# Patient Record
Sex: Female | Born: 2000 | Race: Black or African American | Hispanic: No | Marital: Single | Smoking: Never smoker
Health system: Southern US, Community
[De-identification: ages and names within clinical notes are randomized; demographics above are authoritative.]

## PROBLEM LIST (undated history)

## (undated) HISTORY — PX: LEG SURGERY: SHX1003

## (undated) HISTORY — PX: TIBIA FRACTURE SURGERY: SHX806

## (undated) HISTORY — PX: FRACTURE SURGERY: SHX138

---

## 2018-01-04 ENCOUNTER — Encounter (FREE_STANDING_LABORATORY_FACILITY): Payer: 59 | Admitting: NURSE PRACTITIONER-WOMENS HEALTH

## 2018-01-04 ENCOUNTER — Ambulatory Visit (INDEPENDENT_AMBULATORY_CARE_PROVIDER_SITE_OTHER): Payer: 59 | Admitting: NURSE PRACTITIONER-WOMENS HEALTH

## 2018-01-04 ENCOUNTER — Encounter (FREE_STANDING_LABORATORY_FACILITY)
Admit: 2018-01-04 | Discharge: 2018-01-04 | Disposition: A | Discharge status: Home / Self Care | Payer: 59 | Attending: NURSE PRACTITIONER-WOMENS HEALTH | Admitting: NURSE PRACTITIONER-WOMENS HEALTH

## 2018-01-04 ENCOUNTER — Encounter (INDEPENDENT_AMBULATORY_CARE_PROVIDER_SITE_OTHER): Payer: Self-pay | Admitting: NURSE PRACTITIONER-WOMENS HEALTH

## 2018-01-04 VITALS — BP 142/91 | HR 98 | Temp 97.2°F | Resp 17 | Ht 67.2 in | Wt 171.6 lb

## 2018-01-04 DIAGNOSIS — N898 Other specified noninflammatory disorders of vagina: Secondary | ICD-10-CM | POA: Insufficient documentation

## 2018-01-04 DIAGNOSIS — Z3202 Encounter for pregnancy test, result negative: Secondary | ICD-10-CM

## 2018-01-04 MED ORDER — TERCONAZOLE 0.4 % VAGINAL CREAM
1.00 | TOPICAL_CREAM | Freq: Every evening | VAGINAL | 0 refills | Status: AC
Start: 2018-01-04 — End: ?

## 2018-01-04 NOTE — Progress Notes (Signed)
WELL [place] [facility], HEALTH/EDUCATION BLDG  [address]  [place] [address]    PATIENT NAME:  Jasmine Erickson  MRN:  [id number]  DOB:  06-Jul-2000  DATE OF SERVICE: 01/04/2018    Chief Complaint   Patient presents with   . Vaginal Bumps     irritation       History of Present Illness: Tatumn Aispuro is a 17 y.o. female who presents to [facility] today for the above complaint.  HPI    Patient admits to   new onset of genital lesions x 1 day  Patient denies  exposure to STI  Known exposure to no known diseases.   Patient has history of  no prior STI's..   Last sexual contact 1Day ago with 1 partners.  Was this contact consensual? yes   Contact protected: no   Condom use: 50%-25%of the time.   Sexual partners in last 60 days: 1  Lifetime sexual partners: 1  Gender of current/past partners Female.  Contraception method: oral contraceptives  Participates in oral vaginal sex.   Hx of IVDU or intranasal DU in self or past/present partners: no  Has paid or has been paid for sex in the past: no  Have you completed the HPV vaccine series: yes    On average, how many days/week do you engage in moderate to vigorous physical activity (like brisk walking)?: 0 Days      Past Medical History:    History reviewed. No pertinent past medical history.      Past Surgical History:    Past Surgical History:   Procedure Laterality Date   . LEG SURGERY Right          Allergies:  No Known Allergies  Medications:  Outpatient Medications Marked as Taking for the 01/04/18 encounter (Office Visit) with Anthoney Harada, APRN,WHNP-BC   Medication Sig   . oral contraceptive (PATIENT'S OWN SUPPLY) Take 1 Tab by mouth Once a day   . terconazole (TERAZOL 7) 0.4 % Vaginal Cream 1 Applicator by Vaginal route Every night     Social History:    Social History     Tobacco Use   . Smoking status: Never Smoker   . Smokeless tobacco: Never Used   Substance Use Topics   . Alcohol use: Yes      Family History:  Family Medical  History:     None            Review of Systems:  Review of Systems   Constitutional: Negative for chills and fever.   Gastrointestinal: Negative for nausea and vomiting.   Genitourinary: Positive for genital sores. Negative for dysuria, flank pain, frequency, menstrual problem, pelvic pain, vaginal discharge and vaginal pain.     Physical Exam:  Vitals:    01/04/18 1001   BP: (!) 142/91   Pulse: 98   Resp: 17   Temp: 36.2 C (97.2 F)   TempSrc: Thermal Scan   SpO2: 96%   Weight: 77.8 kg (171 lb 9.6 oz)   Height: 1.707 m (5' 7.2")   BMI: 26.77     87 %ile (Z= 1.13) based on CDC (Girls, 2-20 Years) BMI-for-age based on BMI available as of 01/04/2018.    Body mass index is 26.72 kg/m.  Physical Exam  Constitutional:       Appearance: She is well-developed.   HENT:      Head: Normocephalic and atraumatic.   Genitourinary:  General: Normal vulva.      Exam position: Lithotomy position.      Labia:         Right: Tenderness present. No rash or lesion.         Left: Tenderness present. No rash or lesion.       Vagina: No foreign body. Erythema present. No vaginal discharge.      Cervix: No discharge, lesion or erythema.      Uterus: Not enlarged and not tender.       Adnexa:         Right: No mass, tenderness or fullness.          Left: No mass, tenderness or fullness.         Musculoskeletal: Normal range of motion.   Skin:     General: Skin is warm and dry.   Neurological:      Mental Status: She is alert and oriented to person, place, and time.   Psychiatric:         Behavior: Behavior normal.         Thought Content: Thought content normal.         Judgment: Judgment normal.       Ortho Exam    Data Reviewed:    POCT Results:       Provider Performed POCT 01/04/2018   [facility] [address], [place], [place] [id number]   Time Mon Jan 04, 2018 11:01 AM   Patient Gender Female   Time 10:25 AM   Trichomonas Test  None Seen   Trichomonas Reference Range (Required) None Seen   Yeast Test  Seen   Yeast Ref  Range(Required) None Seen   Clue Cells Test None Seen   Clue Cells Ref Range (Required) None Seen   Specimen Source Vaginal Fluid       Urine Pregnancy: Negative    I have reviewed and confirmed the above point of care results.  Anthoney Haradaarrie A Pratt, APRN,WHNP-BC 01/04/2018, 11:01  UML labs ordered    Assessment:   1. Vaginal irritation      Plan:    Orders Placed This Encounter   . NEISSERIA GONORRHOEAE DNA BY PCR   . CHLAMYDIA TRACHOMITIS DNA BY PCR (INHOUSE)   . HERPES SIMPLEX VIRUS (HSV1/HSV2), PCR, CSF OR SWAB   . POCT VAGINAL KOH & SALINE (WET MOUNT ONLY)   . POCT URINE PREGNANCY   . terconazole (TERAZOL 7) 0.4 % Vaginal Cream     Use terazol nightly x 7 nights  STD testing will be back in a few days and we will call with results all positive STDs are reported to the [facility] per [place] law. The best way to protect yourself from STDs is reducing number of partners and 100% condom use       Return if symptoms worsen or fail to improve.  Anthoney Haradaarrie A Pratt, APRN,WHNP-BC  The co-signing faculty was physically present in [facility] and available for consultation and did not particpate in the care of this patient.

## 2018-01-04 NOTE — Patient Instructions (Addendum)
Preventing Vaginitis     Use mild, unscented soap when you bathe or shower to avoid irritating your vagina.    Vaginitis is irritation or infection of the vagina or the outside opening of it (vulva).Vaginitis can be caused by bacteria, viruses, parasites, or yeast. Chemicals such as in perfumes or soaps or in spermicides can sometimes be a cause. Vaginitis can be caused by hormone changes in pregnancy or with menopause.You can help prevent vaginitis. Follow the tips below. And see your healthcare provider if you have any symptoms.   Hygiene   Stay away from chemicals. Don't use vaginal sprays. Don't use scented toilet paper or tampons that are scented. Sprays and scents have chemicals that can irritate your vagina.   Don't douche unless you are told to by your healthcare provider. Douching is rarely needed. And it upsets the normal balance in the vagina.   Wash yourself well. Wash the outer vaginal area (vulva) every day with mild, unscented soap. Keep it as dry as possible.   Wipe correctly. Make sure to wipe from front to back after a bowel movement. This helps keep from spreading bacteria from your anus to your vagina.   Change your tampon often. During your period, make sure to change your tampon as often as directed on the package. This allows the normal flow of vaginal discharge and blood.    Lifestyle   Limit your number of sexual partners. The more partners you have, the greater your risk of infection. Using condoms helps reduce your risk.   Get enough sleep. Sleep helps keep your body's immune system healthy. This helps you fight infection.   Lose weight, if needed. Excess weight can reduce air circulation around your vagina. This can increase your risk of infection.   Exercise regularly. Regular activity helps keep your body healthy.   Take antibiotics only as directed.Antibiotics can change the normal chemical balance in the vagina.    Clothing   Don't sit in wet clothes. Yeast thrives  when it's warm and damp.   Don't wear tight pants. And don't wear tights, leggings, or hose without a cotton crotch. These types of clothing trap warmth and moisture.   Wear cotton underwear. Cotton lets air circulate around the vagina.    Symptoms of vaginitis   Irritation, swelling, or itching of the genital area   Vaginal discharge   Bad vaginal odor   Pain or burning during urination    StayWell last reviewed this educational content on 11/20/2017   2000-2019 [employer], [place]. [address], [place], [place] [address]. All rights reserved. This information is not intended as a substitute for professional medical care. Always follow your healthcare professional's instructions.        [facility]     Operated by [facility]  [address], [place] [address]  Phone: [phone]  Fax: [phone]  [email or web address]  Twitter [email or web address]   Closed all  Holidays        Attending Caregiver: Anthoney Harada, APRN,WHNP-BC    Today's orders:   Orders Placed This Encounter   . NEISSERIA GONORRHOEAE DNA BY PCR   . CHLAMYDIA TRACHOMITIS DNA BY PCR (INHOUSE)   . HERPES SIMPLEX VIRUS (HSV1/HSV2), PCR, CSF OR SWAB   . POCT VAGINAL KOH & SALINE (WET MOUNT ONLY)   . POCT URINE PREGNANCY   . terconazole (TERAZOL 7) 0.4 % Vaginal Cream         Prescription(s) E-Rx to:  [facility] -  [place], [place] - [address]    Future appts with Student [facility]  Visit date not found    PLEASE ACTIVATE YOUR [facility] MYCHART. THIS IS ONE EASY WAY TO COMMUNICATE WITH [facility].  SEE THE CODE ON THIS FORM FOR ACCESS.    -----------------------------PRIVACY INFORMATION-----------------------------------------------  As a [employer] student, regardless of your age, we cannot discuss your personal health information with a parent, spouse, family member or anyone else without your expressed consent.    This policy does not include:  Individuals who would have  a legitimate reason to access your records and information to assist in your care under the provisions of HIPAA [facility](Health Insurance Portability and Accountability Act) law;   Individuals with whom you have previously given expressed written consent to do so, such a legal guardian or Power of [address].    No one can access your [facility], unless you give them your account sign on and password. You will receive emails from [facility].  This means anyone who has access to the email account you provided can see this notification. There will be no private medical information included in these emails. This notification of  new medical information  available in your [facility], may be information that you do not want others to know.   _______________________________________________________________________    If your symptoms persist, worsen or you develop any new or concerning symptoms please call [facility] for at [phone] for a follow up appointment.   If your symptoms are severe, go immediately to the emergency department or call 911.  Please check with your health insurance coverage for these types of visits.   Please keep all follow up visit recommended at today's visit.    If you received x-rays during your visit, be aware that the final and formal interpretation of those films by a radiologist will occur after your discharge.  If there is a significant discrepancy identified after your discharge, we will contact you at the telephone number provided during registration.  These results are available for your review on [facility].    Please refer to your [facility] for lab test results. Lab test results related to HIV, STI's and pregnancy are considered private and are therefore not immediately visible in your [facility], we may still be able to send a generic MyChart message for these results.  If you have cultures pending for sexually transmitted diseases, you will be contacted by phone.     Positive  cultures are reported to the [facility] Department of Health, as required by state law. These results are considered private on [facility] and can only be released by the provider.We will not contact you if the results are negative.    It is very important that we have a phone number that is the single best way to contact you in the event that we become aware of important clinical information or concerns after your discharge.  If the phone number you provided at registration is NOT this number you should inform staff and registration prior to leaving.     Please call [facility] at [phone] with any further questions.    Instructions discussed with patient upon discharge by clinical staff with all questions answered.    Anthoney Haradaarrie A Pratt, APRN,WHNP-BC 01/04/2018, 10:25

## 2018-01-05 LAB — HERPES SIMPLEX VIRUS (HSV1/HSV2), PCR, CSF OR SWAB
HERPES SIMPLEX VIRUS 1: NEGATIVE
HERPES SIMPLEX VIRUS 2: NEGATIVE

## 2018-01-05 LAB — NEISSERIA GONORRHOEAE DNA BY PCR: NEISSERIA GONORRHOEAE PCR: NOT DETECTED

## 2018-01-05 LAB — CHLAMYDIA TRACHOMITIS DNA BY PCR (INHOUSE): CHLAMYDIA TRACHOMATIS PCR: NOT DETECTED

## 2019-03-20 ENCOUNTER — Other Ambulatory Visit: Payer: Self-pay

## 2019-03-20 ENCOUNTER — Ambulatory Visit (HOSPITAL_COMMUNITY)
Admission: EM | Admit: 2019-03-20 | Discharge: 2019-03-20 | Disposition: A | Discharge status: Home / Self Care | Payer: 59 | Attending: Physician Assistant | Admitting: Physician Assistant

## 2019-03-20 ENCOUNTER — Encounter (HOSPITAL_COMMUNITY): Payer: Self-pay

## 2019-03-20 DIAGNOSIS — R112 Nausea with vomiting, unspecified: Secondary | ICD-10-CM | POA: Diagnosis not present

## 2019-03-20 DIAGNOSIS — R197 Diarrhea, unspecified: Secondary | ICD-10-CM

## 2019-03-20 MED ORDER — ONDANSETRON 4 MG PO TBDP
ORAL_TABLET | ORAL | Status: AC
  Filled 2019-03-20: qty 1

## 2019-03-20 MED ORDER — ONDANSETRON 4 MG PO TBDP
4.0000 mg | ORAL_TABLET | Freq: Once | ORAL | Status: AC
  Administered 2019-03-20: 11:00:00 4 mg via ORAL

## 2019-03-20 NOTE — ED Triage Notes (Signed)
Pt state she had a Four Loc ( alcohol beverage) and Shrimp. Pt states after she had that she started vomiting and diarrhea started. This all started last night.

## 2019-03-20 NOTE — Discharge Instructions (Signed)
I believe this could be related to the alcohol intake and possibly the strength you consumed.  There are no indications currently for antibiotics and typically they do not shorten the course if this is food poisoning.  I want you to utilize the Zofran every 8 hours for nausea and vomiting.  If you continue to have frequent loose bowel movements into tomorrow I would like for you to take Imodium as directed on the box.  Do not take Imodium if you have severe abdominal pain or blood in your stool.  These would also be reasons to come back to urgent care or go to the emergency department.  I want you to drink around 100 ounces of water and consume either a Pedialyte or Gatorade.  Avoid red, orange, purple color beverages.

## 2019-03-20 NOTE — ED Provider Notes (Signed)
[place]    CSN: [id number] Arrival date & time: 03/20/19  1004      History   Chief Complaint Chief Complaint  Patient presents with  . Emesis  . Diarrhea    HPI Angela Gay is a 19 y.o. female.   Patient reports urgent care for 12 to 18 hours of nausea, vomiting and diarrhea.  She reports drinking 1 4 loco alcoholic drink last night.  These are stronger than regular beer and larger beverage size.  She reports this is the first time she is consumed alcohol.  She also reports eating hibachi shrimp around 7 PM last night.  She reports the nausea and vomiting beginning after she had felt intoxicated.  She denies room spinning sensation.  She continues to endorse dry heaving and tan-colored vomit in small amounts.  She has also developed loose stools going semifrequently according to her.  She reports at this point she has minimal stool production.  She denies abdominal pain when having bowel movements.  She denies blood in her stool.  She had 1 incident of lower abdominal cramping in office today otherwise has not had abdominal pain.  She denies painful urination, frequent urination, urinary urgency.  She denies headache, fever, chills, body ache.  Denies other cold-like symptoms.  She reports no one else is sick who consumed the same foods.  She reports her last menstrual period was at the end of January and early February.  She has not been sexually active in 5 to 6 months.     History reviewed. No pertinent past medical history.  There are no problems to display for this patient.   History reviewed. No pertinent surgical history.  OB History   No obstetric history on file.      Home Medications    Prior to Admission medications   Not on File    Family History History reviewed. No pertinent family history.  Social History Social History   Tobacco Use  . Smoking status: Never Smoker  . Smokeless tobacco: Never Used  Substance Use Topics  . Alcohol  use: Yes  . Drug use: Yes    Types: Marijuana     Allergies   Patient has no known allergies.   Review of Systems Review of Systems  Constitutional: Negative for chills, fatigue and fever.  HENT: Negative.  Negative for congestion, ear pain and sore throat.   Eyes: Negative for pain and visual disturbance.  Respiratory: Negative for cough and shortness of breath.   Cardiovascular: Negative for chest pain and palpitations.  Gastrointestinal: Positive for abdominal pain, diarrhea, nausea and vomiting. Negative for constipation.  Genitourinary: Negative for dysuria, flank pain, frequency, hematuria, urgency, vaginal bleeding, vaginal discharge and vaginal pain.  Musculoskeletal: Negative for arthralgias, back pain and myalgias.  Skin: Negative for color change and rash.  Neurological: Negative for dizziness, seizures, syncope and headaches.  All other systems reviewed and are negative.    Physical Exam Triage Vital Signs ED Triage Vitals [03/20/19 1018]  Enc Vitals Group     BP      Pulse      Resp      Temp      Temp src      SpO2      Weight 178 lb (80.7 kg)     Height      Head Circumference      Peak Flow      Pain Score 7     Pain Loc  Pain Edu?      Excl. in GC?    No data found.  Updated Vital Signs BP 114/60 (BP Location: Right Arm)   Pulse 85   Temp 99 F (37.2 C) (Oral)   Resp 18   Wt 178 lb (80.7 kg)   LMP 02/20/2019   SpO2 99%   Visual Acuity Right Eye Distance:   Left Eye Distance:   Bilateral Distance:    Right Eye Near:   Left Eye Near:    Bilateral Near:     Physical Exam Vitals and nursing note reviewed.  Constitutional:      General: She is not in acute distress.    Appearance: She is well-developed. She is ill-appearing.  HENT:     Head: Normocephalic and atraumatic.  Eyes:     General: No scleral icterus.    Extraocular Movements: Extraocular movements intact.     Conjunctiva/sclera: Conjunctivae normal.     Pupils:  Pupils are equal, round, and reactive to light.  Cardiovascular:     Rate and Rhythm: Normal rate and regular rhythm.     Heart sounds: No murmur.  Pulmonary:     Effort: Pulmonary effort is normal. No respiratory distress.     Breath sounds: Normal breath sounds. No wheezing, rhonchi or rales.  Abdominal:     General: Bowel sounds are normal. There is no distension.     Palpations: Abdomen is soft.     Tenderness: There is no abdominal tenderness. There is no right CVA tenderness or left CVA tenderness.  Musculoskeletal:        General: Normal range of motion.     Cervical back: Normal range of motion and neck supple.  Skin:    General: Skin is warm and dry.     Findings: No erythema.  Neurological:     General: No focal deficit present.     Mental Status: She is alert and oriented to person, place, and time.  Psychiatric:        Mood and Affect: Mood normal.        Behavior: Behavior normal.        Thought Content: Thought content normal.        Judgment: Judgment normal.      UC Treatments / Results  Labs (all labs ordered are listed, but only abnormal results are displayed) Labs Reviewed - No data to display  EKG   Radiology No results found.  Procedures Procedures (including critical care time)  Medications Ordered in UC Medications  ondansetron (ZOFRAN-ODT) disintegrating tablet 4 mg (4 mg Oral Given 03/20/19 1100)    Initial Impression / Assessment and Plan / UC Course  I have reviewed the triage vital signs and the nursing notes.  Pertinent labs & imaging results that were available during my care of the patient were reviewed by me and considered in my medical decision making (see chart for details).    #Nausea vomiting and diarrhea Patient is a 19 year old female presenting with acute nausea, vomiting and diarrhea.  Given alcohol intake and feeling of intoxication and that she is nave to alcohol feel that this is a possible etiology of her nausea and  vomiting.  This may also be foodborne given shellfish consumption.  She is afebrile and hemodynamically stable.  We will treat symptomatically with Zofran and encourage p.o. intake with addition of electrolyte beverages.  Instructed to begin Imodium if continuing to have frequent stools tomorrow so long as there is no severe abdominal  pain or blood in her stool.  Patient understands plan and agrees.  Discussed that because she is under age for alcohol consumption that she should abstain from alcohol use.  Final Clinical Impressions(s) / UC Diagnoses   Final diagnoses:  Nausea vomiting and diarrhea     Discharge Instructions     I believe this could be related to the alcohol intake and possibly the strength you consumed.  There are no indications currently for antibiotics and typically they do not shorten the course if this is food poisoning.  I want you to utilize the Zofran every 8 hours for nausea and vomiting.  If you continue to have frequent loose bowel movements into tomorrow I would like for you to take Imodium as directed on the box.  Do not take Imodium if you have severe abdominal pain or blood in your stool.  These would also be reasons to come back to urgent care or go to the emergency department.  I want you to drink around 100 ounces of water and consume either a Pedialyte or Gatorade.  Avoid red, orange, purple color beverages.      ED Prescriptions    None     PDMP not reviewed this encounter.   Hermelinda Medicus, PA-C 03/20/19 1104

## 2020-05-08 ENCOUNTER — Encounter (HOSPITAL_COMMUNITY): Payer: Self-pay | Admitting: Emergency Medicine

## 2020-05-08 ENCOUNTER — Other Ambulatory Visit: Payer: Self-pay

## 2020-05-08 ENCOUNTER — Ambulatory Visit (HOSPITAL_COMMUNITY)
Admission: EM | Admit: 2020-05-08 | Discharge: 2020-05-08 | Disposition: A | Discharge status: Home / Self Care | Payer: 59 | Attending: Family Medicine | Admitting: Family Medicine

## 2020-05-08 DIAGNOSIS — J301 Allergic rhinitis due to pollen: Secondary | ICD-10-CM

## 2020-05-08 DIAGNOSIS — Z1152 Encounter for screening for COVID-19: Secondary | ICD-10-CM

## 2020-05-08 NOTE — Discharge Instructions (Signed)
-  Try zyrtec, once daily. Try this for at least 1 week. Continue for longer if symptoms persist.  -You can also try flonase nasal spray (fluticasone), 1-2x daily. Also try this for about 1 week.  -Your covid test will come back tomorrow, this will go straight to your email and mychart.

## 2020-05-08 NOTE — ED Triage Notes (Signed)
Pt here for sore throat onset 2 days associated w/PND  Reports she took a COVID test at home and it was negative  Denies f/vn/d  Would like to get COVID tested to make sure.   A&O x4... NAD.Marland Kitchen. ambulatory

## 2020-05-08 NOTE — ED Provider Notes (Signed)
[facility]    CSN: [id number] Arrival date & time: 05/08/20  1521      History   Chief Complaint Chief Complaint  Patient presents with  . Sore Throat    HPI Angela Gay is a 20 y.o. female presenting with postnasal drip and scratchy throat for 2 days.  Took a COVID test at home and states this was negative.  Feeling well otherwise, denies fevers, cough, nausea, diarrhea, body aches, etc.  HPI  History reviewed. No pertinent past medical history.  There are no problems to display for this patient.   History reviewed. No pertinent surgical history.  OB History   No obstetric history on file.      Home Medications    Prior to Admission medications   Not on File    Family History History reviewed. No pertinent family history.  Social History Social History   Tobacco Use  . Smoking status: Never Smoker  . Smokeless tobacco: Never Used  Substance Use Topics  . Alcohol use: Yes  . Drug use: Yes    Types: Marijuana     Allergies   Patient has no known allergies.   Review of Systems Review of Systems  Constitutional: Negative for appetite change, chills and fever.  HENT: Positive for congestion, postnasal drip and sore throat. Negative for ear pain, rhinorrhea, sinus pressure and sinus pain.   Eyes: Negative for redness and visual disturbance.  Respiratory: Negative for cough, chest tightness, shortness of breath and wheezing.   Cardiovascular: Negative for chest pain and palpitations.  Gastrointestinal: Negative for abdominal pain, constipation, diarrhea, nausea and vomiting.  Genitourinary: Negative for dysuria, frequency and urgency.  Musculoskeletal: Negative for myalgias.  Neurological: Negative for dizziness, weakness and headaches.  Psychiatric/Behavioral: Negative for confusion.  All other systems reviewed and are negative.    Physical Exam Triage Vital Signs ED Triage Vitals  Enc Vitals Group     BP 05/08/20 1628 (!) 116/58      Pulse Rate 05/08/20 1628 70     Resp 05/08/20 1628 16     Temp 05/08/20 1628 98.2 F (36.8 C)     Temp Source 05/08/20 1628 Oral     SpO2 05/08/20 1628 99 %     Weight --      Height --      Head Circumference --      Peak Flow --      Pain Score 05/08/20 1630 0     Pain Loc --      Pain Edu? --      Excl. in GC? --    No data found.  Updated Vital Signs BP (!) 116/58 (BP Location: Left Arm)   Pulse 70   Temp 98.2 F (36.8 C) (Oral)   Resp 16   LMP 05/01/2020   SpO2 99%   Visual Acuity Right Eye Distance:   Left Eye Distance:   Bilateral Distance:    Right Eye Near:   Left Eye Near:    Bilateral Near:     Physical Exam Vitals reviewed.  Constitutional:      General: She is not in acute distress.    Appearance: Normal appearance. She is not ill-appearing.  HENT:     Head: Normocephalic and atraumatic.     Right Ear: Hearing, tympanic membrane, ear canal and external ear normal. No swelling or tenderness. There is no impacted cerumen. No mastoid tenderness. Tympanic membrane is not perforated, erythematous, retracted or bulging.  Left Ear: Hearing, tympanic membrane, ear canal and external ear normal. No swelling or tenderness. There is no impacted cerumen. No mastoid tenderness. Tympanic membrane is not perforated, erythematous, retracted or bulging.     Nose:     Right Sinus: No maxillary sinus tenderness or frontal sinus tenderness.     Left Sinus: No maxillary sinus tenderness or frontal sinus tenderness.     Mouth/Throat:     Mouth: Mucous membranes are moist.     Pharynx: Uvula midline. No oropharyngeal exudate or posterior oropharyngeal erythema.     Tonsils: No tonsillar exudate.  Cardiovascular:     Rate and Rhythm: Normal rate and regular rhythm.     Heart sounds: Normal heart sounds.  Pulmonary:     Breath sounds: Normal breath sounds and air entry. No wheezing, rhonchi or rales.  Chest:     Chest wall: No tenderness.  Abdominal:     General:  Abdomen is flat. Bowel sounds are normal.     Tenderness: There is no abdominal tenderness. There is no guarding or rebound.  Lymphadenopathy:     Cervical: No cervical adenopathy.  Neurological:     General: No focal deficit present.     Mental Status: She is alert and oriented to person, place, and time.  Psychiatric:        Attention and Perception: Attention and perception normal.        Mood and Affect: Mood and affect normal.        Behavior: Behavior normal. Behavior is cooperative.        Thought Content: Thought content normal.        Judgment: Judgment normal.      UC Treatments / Results  Labs (all labs ordered are listed, but only abnormal results are displayed) Labs Reviewed  SARS CORONAVIRUS 2 (TAT 6-24 HRS)    EKG   Radiology No results found.  Procedures Procedures (including critical care time)  Medications Ordered in UC Medications - No data to display  Initial Impression / Assessment and Plan / UC Course  I have reviewed the triage vital signs and the nursing notes.  Pertinent labs & imaging results that were available during my care of the patient were reviewed by me and considered in my medical decision making (see chart for details).     This patient is a 20 year old female presenting with allergic rhinitis.  Trial of Zyrtec and Flonase as below.  COVID PCR sent at patient request.  Final Clinical Impressions(s) / UC Diagnoses   Final diagnoses:  Seasonal allergic rhinitis due to pollen  Encounter for screening for COVID-19     Discharge Instructions     -Try zyrtec, once daily. Try this for at least 1 week. Continue for longer if symptoms persist.  -You can also try flonase nasal spray (fluticasone), 1-2x daily. Also try this for about 1 week.  -Your covid test will come back tomorrow, this will go straight to your email and mychart.    ED Prescriptions    None     PDMP not reviewed this encounter.   Rhys Martini,  PA-C 05/08/20 1658

## 2020-05-09 LAB — SARS CORONAVIRUS 2 (TAT 6-24 HRS): SARS Coronavirus 2: POSITIVE — AB

## 2020-09-04 ENCOUNTER — Ambulatory Visit (HOSPITAL_COMMUNITY)
Admission: EM | Admit: 2020-09-04 | Discharge: 2020-09-04 | Disposition: A | Discharge status: Home / Self Care | Payer: 59 | Attending: Family Medicine | Admitting: Family Medicine

## 2020-09-04 ENCOUNTER — Other Ambulatory Visit: Payer: Self-pay

## 2020-09-04 ENCOUNTER — Encounter (HOSPITAL_COMMUNITY): Payer: Self-pay

## 2020-09-04 DIAGNOSIS — J0100 Acute maxillary sinusitis, unspecified: Secondary | ICD-10-CM

## 2020-09-04 MED ORDER — AMOXICILLIN-POT CLAVULANATE 875-125 MG PO TABS: 1.0000 | ORAL_TABLET | Freq: Two times a day (BID) | ORAL | 0 refills | Status: DC

## 2020-09-04 MED ORDER — FLUTICASONE PROPIONATE 50 MCG/ACT NA SUSP: 1.0000 | Freq: Two times a day (BID) | NASAL | 2 refills | Status: DC

## 2020-09-04 NOTE — ED Provider Notes (Signed)
[facility]    CSN: [id number] Arrival date & time: 09/04/20  0803      History   Chief Complaint Chief Complaint  Patient presents with   Facial Pain   Nasal Congestion   Headache    HPI Angela Gay is a 20 y.o. female.   Patient presenting today with 10-day history of congestion, cough, scratchy throat, malaise that is overall resolving other than the congestion and now the past 2 days worsening right-sided facial pain, headache.  Denies fever, chills, shortness of breath, chest pain, abdominal pain, nausea vomiting or diarrhea.  Took multiple COVID test since onset of symptoms over a week ago that were all negative.  No known sick contacts recently.  No known history of chronic medical problems.  So far taking Mucinex with no relief.  History reviewed. No pertinent past medical history.  There are no problems to display for this patient.  History reviewed. No pertinent surgical history.  OB History   No obstetric history on file.      Home Medications    Prior to Admission medications   Medication Sig Start Date End Date Taking? Authorizing Provider  amoxicillin-clavulanate (AUGMENTIN) 875-125 MG tablet Take 1 tablet by mouth every 12 (twelve) hours. 09/04/20  Yes Particia Nearing, PA-C  fluticasone [facility]) 50 MCG/ACT nasal spray Place 1 spray into both nostrils 2 (two) times daily. 09/04/20  Yes Particia Nearing, PA-C    Family History History reviewed. No pertinent family history.  Social History Social History   Tobacco Use   Smoking status: Never   Smokeless tobacco: Never  Substance Use Topics   Alcohol use: Yes   Drug use: Yes    Types: Marijuana     Allergies   Patient has no known allergies.   Review of Systems Review of Systems Per HPI  Physical Exam Triage Vital Signs ED Triage Vitals  Enc Vitals Group     BP 09/04/20 0819 115/68     Pulse Rate 09/04/20 0819 89     Resp 09/04/20 0819 19     Temp 09/04/20 0819  98.6 F (37 C)     Temp Source 09/04/20 0819 Oral     SpO2 09/04/20 0819 98 %     Weight --      Height --      Head Circumference --      Peak Flow --      Pain Score 09/04/20 0818 5     Pain Loc --      Pain Edu? --      Excl. in GC? --    No data found.  Updated Vital Signs BP 115/68 (BP Location: Right Arm)   Pulse 89   Temp 98.6 F (37 C) (Oral)   Resp 19   LMP 08/25/2020 (Exact Date)   SpO2 98%   Visual Acuity Right Eye Distance:   Left Eye Distance:   Bilateral Distance:    Right Eye Near:   Left Eye Near:    Bilateral Near:     Physical Exam Vitals and nursing note reviewed.  Constitutional:      Appearance: Normal appearance. She is not ill-appearing.  HENT:     Head: Atraumatic.     Comments: Right-sided maxillary sinus tender to palpation    Right Ear: Tympanic membrane normal.     Left Ear: Tympanic membrane normal.     Nose: Congestion present.     Mouth/Throat:     Mouth:  Mucous membranes are moist.     Pharynx: Posterior oropharyngeal erythema present.  Eyes:     Extraocular Movements: Extraocular movements intact.     Conjunctiva/sclera: Conjunctivae normal.  Cardiovascular:     Rate and Rhythm: Normal rate and regular rhythm.     Heart sounds: Normal heart sounds.  Pulmonary:     Effort: Pulmonary effort is normal.     Breath sounds: Normal breath sounds. No wheezing or rales.  Musculoskeletal:        General: Normal range of motion.     Cervical back: Normal range of motion and neck supple.  Skin:    General: Skin is warm and dry.     Findings: No rash.  Neurological:     Mental Status: She is alert and oriented to person, place, and time.  Psychiatric:        Mood and Affect: Mood normal.        Thought Content: Thought content normal.        Judgment: Judgment normal.     UC Treatments / Results  Labs (all labs ordered are listed, but only abnormal results are displayed) Labs Reviewed - No data to  display  EKG   Radiology No results found.  Procedures Procedures (including critical care time)  Medications Ordered in UC Medications - No data to display  Initial Impression / Assessment and Plan / UC Course  I have reviewed the triage vital signs and the nursing notes.  Pertinent labs & imaging results that were available during my care of the patient were reviewed by me and considered in my medical decision making (see chart for details).     Will treat with Augmentin, Flonase, Mucinex, sinus rinses for maxillary sinusitis.  Discussed over-the-counter pain relievers as needed for the headache.  Follow-up if worsening or not resolving.  Final Clinical Impressions(s) / UC Diagnoses   Final diagnoses:  Acute non-recurrent maxillary sinusitis   Discharge Instructions   None    ED Prescriptions     Medication Sig Dispense Auth. Provider   amoxicillin-clavulanate (AUGMENTIN) 875-125 MG tablet Take 1 tablet by mouth every 12 (twelve) hours. 14 tablet Particia Nearing, PA-C   fluticasone [facility]) 50 MCG/ACT nasal spray Place 1 spray into both nostrils 2 (two) times daily. 16 g Particia Nearing, [place]      PDMP not reviewed this encounter.   Particia Nearing, [place] 09/04/20 1503

## 2020-09-04 NOTE — ED Triage Notes (Signed)
Pt presents with facial pain and nasal congestion and headaches X 2 days.   States she took Mucinex and states it give her relief.

## 2020-09-12 ENCOUNTER — Encounter (HOSPITAL_COMMUNITY): Payer: Self-pay

## 2020-09-12 ENCOUNTER — Other Ambulatory Visit: Payer: Self-pay

## 2020-09-12 ENCOUNTER — Ambulatory Visit (HOSPITAL_COMMUNITY)
Admission: EM | Admit: 2020-09-12 | Discharge: 2020-09-12 | Disposition: A | Discharge status: Home / Self Care | Payer: 59 | Attending: Medical Oncology | Admitting: Medical Oncology

## 2020-09-12 DIAGNOSIS — L509 Urticaria, unspecified: Secondary | ICD-10-CM

## 2020-09-12 DIAGNOSIS — L270 Generalized skin eruption due to drugs and medicaments taken internally: Secondary | ICD-10-CM

## 2020-09-12 MED ORDER — METHYLPREDNISOLONE SODIUM SUCC 125 MG IJ SOLR
125.0000 mg | Freq: Once | INTRAMUSCULAR | Status: AC
  Administered 2020-09-12: 125 mg via INTRAMUSCULAR

## 2020-09-12 MED ORDER — PREDNISONE 10 MG (21) PO TBPK: ORAL_TABLET | Freq: Every day | ORAL | 0 refills | Status: DC

## 2020-09-12 MED ORDER — CETIRIZINE HCL 10 MG PO TABS: 10.0000 mg | ORAL_TABLET | Freq: Every day | ORAL | 0 refills | Status: AC

## 2020-09-12 MED ORDER — METHYLPREDNISOLONE SODIUM SUCC 125 MG IJ SOLR
INTRAMUSCULAR | Status: AC
  Filled 2020-09-12: qty 2

## 2020-09-12 MED ORDER — EPINEPHRINE 0.3 MG/0.3ML IJ SOAJ: 0.3000 mg | INTRAMUSCULAR | 0 refills | Status: AC | PRN

## 2020-09-12 MED ORDER — FAMOTIDINE 20 MG PO TABS
ORAL_TABLET | ORAL | Status: AC
  Filled 2020-09-12: qty 2

## 2020-09-12 MED ORDER — DIPHENHYDRAMINE HCL 25 MG PO CAPS
ORAL_CAPSULE | ORAL | Status: AC
  Filled 2020-09-12: qty 2

## 2020-09-12 MED ORDER — FAMOTIDINE 20 MG PO TABS
40.0000 mg | ORAL_TABLET | Freq: Every day | ORAL | Status: DC
  Administered 2020-09-12: 40 mg via ORAL

## 2020-09-12 MED ORDER — DIPHENHYDRAMINE HCL 25 MG PO CAPS
50.0000 mg | ORAL_CAPSULE | Freq: Once | ORAL | Status: AC
  Administered 2020-09-12: 50 mg via ORAL

## 2020-09-12 MED ORDER — FAMOTIDINE 20 MG PO TABS: 20.0000 mg | ORAL_TABLET | Freq: Two times a day (BID) | ORAL | 0 refills | Status: AC

## 2020-09-12 NOTE — ED Provider Notes (Signed)
[facility]    CSN: [id number] Arrival date & time: 09/12/20  1253      History   Chief Complaint Chief Complaint  Patient presents with   Rash    HPI Angela Gay is a 20 y.o. female.   HPI  Rash: Patient states that for the past 2 to 3 days she has had an itchy rash of her body.  This is becoming worse.  Rash is located on arms and neck and trunk mostly.  She states that she has mild itchiness of her throat but without any trouble breathing or swallowing.  No wheezing, chest pain or shortness of breath.  She is currently taking Augmentin for a sinus infection that has resolved.  She states that she has 2 days left of the antibiotic.  She did take a dose this morning.  She has not taken anything for her rash.  History reviewed. No pertinent past medical history.  There are no problems to display for this patient.   History reviewed. No pertinent surgical history.  OB History   No obstetric history on file.      Home Medications    Prior to Admission medications   Medication Sig Start Date End Date Taking? Authorizing Provider  cetirizine (ZYRTEC ALLERGY) 10 MG tablet Take 1 tablet (10 mg total) by mouth daily. 09/12/20  Yes Clent Jacks M, PA-C  EPINEPHrine 0.3 mg/0.3 mL IJ SOAJ injection Inject 0.3 mg into the muscle as needed for anaphylaxis. 09/12/20  Yes Tomesha Sargent, Maralyn Sago M, PA-C  famotidine (PEPCID) 20 MG tablet Take 1 tablet (20 mg total) by mouth 2 (two) times daily. 09/13/20  Yes Lorriane Dehart M, PA-C  predniSONE (STERAPRED UNI-PAK 21 TAB) 10 MG (21) TBPK tablet Take by mouth daily. Take 6 tabs by mouth daily  for 2 days, then 5 tabs for 2 days, then 4 tabs for 2 days, then 3 tabs for 2 days, 2 tabs for 2 days, then 1 tab by mouth daily for 2 days 09/13/20  Yes Devonn Giampietro M, PA-C  fluticasone [facility]) 50 MCG/ACT nasal spray Place 1 spray into both nostrils 2 (two) times daily. 09/04/20   Particia Nearing, PA-C    Family History History  reviewed. No pertinent family history.  Social History Social History   Tobacco Use   Smoking status: Never   Smokeless tobacco: Never  Substance Use Topics   Alcohol use: Yes   Drug use: Yes    Types: Marijuana     Allergies   Augmentin [amoxicillin-pot clavulanate]   Review of Systems Review of Systems  As stated above in HPI Physical Exam Triage Vital Signs ED Triage Vitals  Enc Vitals Group     BP 09/12/20 1326 134/79     Pulse Rate 09/12/20 1326 71     Resp 09/12/20 1326 18     Temp 09/12/20 1326 98.5 F (36.9 C)     Temp Source 09/12/20 1326 Oral     SpO2 09/12/20 1326 99 %     Weight --      Height --      Head Circumference --      Peak Flow --      Pain Score 09/12/20 1325 0     Pain Loc --      Pain Edu? --      Excl. in GC? --    No data found.  Updated Vital Signs BP 134/79 (BP Location: Left Arm)   Pulse 71  Temp 98.5 F (36.9 C) (Oral)   Resp 18   LMP 08/25/2020 (Exact Date)   SpO2 99%   Physical Exam Vitals and nursing note reviewed.  Constitutional:      General: She is not in acute distress.    Appearance: Normal appearance. She is not ill-appearing, toxic-appearing or diaphoretic.  HENT:     Head: Normocephalic and atraumatic.     Mouth/Throat:     Pharynx: Oropharynx is clear.  Cardiovascular:     Rate and Rhythm: Normal rate and regular rhythm.     Heart sounds: Normal heart sounds.  Pulmonary:     Effort: Pulmonary effort is normal.     Breath sounds: Normal breath sounds. No stridor. No wheezing.  Skin:    Findings: Rash (urticaria) present.  Neurological:     Mental Status: She is alert and oriented to person, place, and time.     UC Treatments / Results  Labs (all labs ordered are listed, but only abnormal results are displayed) Labs Reviewed - No data to display  EKG   Radiology No results found.  Procedures Procedures (including critical care time)  Medications Ordered in UC Medications   methylPREDNISolone sodium succinate (SOLU-MEDROL) 125 mg/2 mL injection 125 mg (has no administration in time range)  diphenhydrAMINE (BENADRYL) capsule 50 mg (has no administration in time range)  famotidine (PEPCID) tablet 40 mg (has no administration in time range)    Initial Impression / Assessment and Plan / UC Course  I have reviewed the triage vital signs and the nursing notes.  Pertinent labs & imaging results that were available during my care of the patient were reviewed by me and considered in my medical decision making (see chart for details).     New.  This is likely a drug reaction.  She is stopping her Augmentin and we are going to treat in office and send her home with an EpiPen should she have to use this.  At this point she does not appear to have any anaphylaxis so we are not going to administer an EpiPen today however we did discuss signs and symptoms.  Final Clinical Impressions(s) / UC Diagnoses   Final diagnoses:  Urticaria  Drug rash   Discharge Instructions   None    ED Prescriptions     Medication Sig Dispense Auth. Provider   predniSONE (STERAPRED UNI-PAK 21 TAB) 10 MG (21) TBPK tablet Take by mouth daily. Take 6 tabs by mouth daily  for 2 days, then 5 tabs for 2 days, then 4 tabs for 2 days, then 3 tabs for 2 days, 2 tabs for 2 days, then 1 tab by mouth daily for 2 days 42 tablet Zaniah Titterington M, PA-C   cetirizine (ZYRTEC ALLERGY) 10 MG tablet Take 1 tablet (10 mg total) by mouth daily. 7 tablet Nikky Duba M, PA-C   famotidine (PEPCID) 20 MG tablet Take 1 tablet (20 mg total) by mouth 2 (two) times daily. 30 tablet Rochelle Larue M, [place]   EPINEPHrine 0.3 mg/0.3 mL IJ SOAJ injection Inject 0.3 mg into the muscle as needed for anaphylaxis. 1 each Rushie Chestnut, PA-C      PDMP not reviewed this encounter.   Rushie Chestnut, [place] 09/12/20 1348

## 2020-09-12 NOTE — ED Triage Notes (Addendum)
Pt reports rashes to bilateral arms, neck, and legs. States she was taking an abx recently and believes rash is from the abx. Pt states throat "is itchy",  denies SOB. Pt appearance: no s/s of respiratory distress noted.  Started: Around 09/06/2020

## 2020-11-30 ENCOUNTER — Other Ambulatory Visit: Payer: Self-pay | Admitting: Family Medicine

## 2020-12-26 ENCOUNTER — Other Ambulatory Visit: Payer: Self-pay

## 2020-12-26 ENCOUNTER — Ambulatory Visit (HOSPITAL_COMMUNITY)
Admission: EM | Admit: 2020-12-26 | Discharge: 2020-12-26 | Disposition: A | Discharge status: Home / Self Care | Payer: 59 | Attending: Urgent Care | Admitting: Urgent Care

## 2020-12-26 ENCOUNTER — Encounter (HOSPITAL_COMMUNITY): Payer: Self-pay

## 2020-12-26 DIAGNOSIS — B3731 Acute candidiasis of vulva and vagina: Secondary | ICD-10-CM

## 2020-12-26 DIAGNOSIS — Z113 Encounter for screening for infections with a predominantly sexual mode of transmission: Secondary | ICD-10-CM | POA: Diagnosis present

## 2020-12-26 LAB — POCT URINALYSIS DIPSTICK, ED / UC
Bilirubin Urine: NEGATIVE
Glucose, UA: NEGATIVE mg/dL
Hgb urine dipstick: NEGATIVE
Ketones, ur: NEGATIVE mg/dL
Leukocytes,Ua: NEGATIVE
Nitrite: NEGATIVE
Protein, ur: NEGATIVE mg/dL
Specific Gravity, Urine: >=1.03 (ref 1.005–1.030)
Urobilinogen, UA: 0.2 mg/dL (ref 0.0–1.0)
pH: 6 (ref 5.0–8.0)

## 2020-12-26 LAB — HIV ANTIBODY (ROUTINE TESTING W REFLEX): HIV Screen 4th Generation wRfx: NONREACTIVE

## 2020-12-26 LAB — POC URINE PREG, ED: Preg Test, Ur: NEGATIVE

## 2020-12-26 MED ORDER — FLUCONAZOLE 150 MG PO TABS: 150.0000 mg | ORAL_TABLET | Freq: Once | ORAL | 0 refills | Status: AC

## 2020-12-26 NOTE — ED Provider Notes (Signed)
[facility]    CSN: [id number] Arrival date & time: 12/26/20  0840      History   Chief Complaint Chief Complaint  Patient presents with   Exposure to STD    HPI Angela Gay is a 20 y.o. female who presents today requesting a full STI work-up.  She states she has had no form of sexual intercourse for the past 1 year.  She is concerned however because over the past 3 days she has had vaginal irritation.  She denies extensive pruritus, but states it just feels swollen and irritated.  Her underwear touching it is causing concern.  She has not tried any over-the-counter medications.  She states she is about to start her menstrual period.  She denies any lower pelvic pain, dysuria, flank pain, fever, rash, hematuria, odor.  She does have a history of yeast in the past.  She denies history of diabetes takes no daily medications. Despite lack of sexual exposures, she is requesting a full STI panel.    Exposure to STD   History reviewed. No pertinent past medical history.  There are no problems to display for this patient.   History reviewed. No pertinent surgical history.  OB History   No obstetric history on file.      Home Medications    Prior to Admission medications   Medication Sig Start Date End Date Taking? Authorizing Provider  fluconazole (DIFLUCAN) 150 MG tablet Take 1 tablet (150 mg total) by mouth once for 1 dose. 12/26/20 12/26/20 Yes Sharolyn Weber L, PA  cetirizine (ZYRTEC ALLERGY) 10 MG tablet Take 1 tablet (10 mg total) by mouth daily. 09/12/20   Rushie Chestnut, PA-C  EPINEPHrine 0.3 mg/0.3 mL IJ SOAJ injection Inject 0.3 mg into the muscle as needed for anaphylaxis. 09/12/20   Rushie Chestnut, PA-C  famotidine (PEPCID) 20 MG tablet Take 1 tablet (20 mg total) by mouth 2 (two) times daily. 09/13/20   Rushie Chestnut, PA-C  fluticasone (FLONASE) 50 MCG/ACT nasal spray Place 1 spray into both nostrils 2 (two) times daily. 09/04/20   Particia Nearing, PA-C  predniSONE (STERAPRED UNI-PAK 21 TAB) 10 MG (21) TBPK tablet Take by mouth daily. Take 6 tabs by mouth daily  for 2 days, then 5 tabs for 2 days, then 4 tabs for 2 days, then 3 tabs for 2 days, 2 tabs for 2 days, then 1 tab by mouth daily for 2 days 09/13/20   Rushie Chestnut, PA-C    Family History History reviewed. No pertinent family history.  Social History Social History   Tobacco Use   Smoking status: Never   Smokeless tobacco: Never  Substance Use Topics   Alcohol use: Yes   Drug use: Yes    Types: Marijuana     Allergies   Augmentin [amoxicillin-pot clavulanate]   Review of Systems Review of Systems  Genitourinary:  Positive for vaginal discharge ("irritation").  All other systems reviewed and are negative.   Physical Exam Triage Vital Signs ED Triage Vitals [12/26/20 0936]  Enc Vitals Group     BP 126/83     Pulse Rate 70     Resp 16     Temp 98.9 F (37.2 C)     Temp src      SpO2 98 %     Weight      Height      Head Circumference      Peak Flow  Pain Score 0     Pain Loc      Pain Edu?      Excl. in [place]?    No data found.  Updated Vital Signs BP 126/83 (BP Location: Right Arm)   Pulse 70   Temp 98.9 F (37.2 C)   Resp 16   LMP  (Within Months) Comment: 1 month  SpO2 98%   Visual Acuity Right Eye Distance:   Left Eye Distance:   Bilateral Distance:    Right Eye Near:   Left Eye Near:    Bilateral Near:     Physical Exam Vitals and nursing note reviewed. Chaperone present: chaperone offered and declined.  Constitutional:      General: She is not in acute distress.    Appearance: Normal appearance. She is normal weight. She is not toxic-appearing.  HENT:     Head: Normocephalic and atraumatic.  Eyes:     Pupils: Pupils are equal, round, and reactive to light.  Cardiovascular:     Rate and Rhythm: Normal rate and regular rhythm.  Pulmonary:     Effort: Pulmonary effort is normal.     Breath sounds: Normal  breath sounds.  Abdominal:     General: Abdomen is flat. Bowel sounds are normal. There is no distension.     Palpations: Abdomen is soft.     Tenderness: There is no abdominal tenderness. There is no right CVA tenderness, left CVA tenderness, guarding or rebound.  Genitourinary:    General: Normal vulva.     Vagina: Vaginal discharge (thick, white, cottage cheese appearance) present.     Rectum: Normal.  Musculoskeletal:     Cervical back: Normal range of motion.  Skin:    General: Skin is warm.     Coloration: Skin is not jaundiced.     Findings: No rash.  Neurological:     General: No focal deficit present.     Mental Status: She is alert.  Psychiatric:        Mood and Affect: Mood normal.     UC Treatments / Results  Labs (all labs ordered are listed, but only abnormal results are displayed) Labs Reviewed  RPR  HIV ANTIBODY (ROUTINE TESTING W REFLEX)  POC URINE PREG, ED  POCT URINALYSIS DIPSTICK, ED / UC  CERVICOVAGINAL ANCILLARY ONLY    EKG   Radiology No results found.  Procedures Procedures (including critical care time)  Medications Ordered in UC Medications - No data to display  Initial Impression / Assessment and Plan / UC Course  I have reviewed the triage vital signs and the nursing notes.  Pertinent labs & imaging results that were available during my care of the patient were reviewed by me and considered in my medical decision making (see chart for details).     Vulvovaginal candidiasis - PO diflucan x 1 now. May also use OTC monistat if needed STI screening - labs today, will call with any positive results. Unremarkable physical exam  Final Clinical Impressions(s) / UC Diagnoses   Final diagnoses:  Screen for STD (sexually transmitted disease)  Yeast infection of the vagina     Discharge Instructions      Take fluconazole x 1 dose now. May use over-the-counter Monistat in addition if necessary. Labs drawn today, will call with any  positive results. Return to the clinic or follow-up with your primary for any persistent or worsening symptoms.     ED Prescriptions     Medication Sig Dispense Auth. Provider  fluconazole (DIFLUCAN) 150 MG tablet Take 1 tablet (150 mg total) by mouth once for 1 dose. 1 tablet Skyelyn Scruggs L, [place]      PDMP not reviewed this encounter.   Chaney Malling, [place] 12/26/20 1041

## 2020-12-26 NOTE — Discharge Instructions (Signed)
Take fluconazole x 1 dose now. May use over-the-counter Monistat in addition if necessary. Labs drawn today, will call with any positive results. Return to the clinic or follow-up with your primary for any persistent or worsening symptoms.

## 2020-12-26 NOTE — ED Triage Notes (Signed)
Pt requested STD's test. States she is not sexually active currently.

## 2020-12-27 LAB — CERVICOVAGINAL ANCILLARY ONLY
Bacterial Vaginitis (gardnerella): NEGATIVE
Candida Glabrata: NEGATIVE
Candida Vaginitis: NEGATIVE
Chlamydia: NEGATIVE
Comment: NEGATIVE
Comment: NEGATIVE
Comment: NEGATIVE
Comment: NEGATIVE
Comment: NEGATIVE
Comment: NORMAL
Neisseria Gonorrhea: NEGATIVE
Trichomonas: NEGATIVE

## 2020-12-27 LAB — RPR: RPR Ser Ql: NONREACTIVE

## 2021-02-14 ENCOUNTER — Encounter (HOSPITAL_COMMUNITY): Payer: Self-pay | Admitting: Emergency Medicine

## 2021-02-14 ENCOUNTER — Ambulatory Visit (HOSPITAL_COMMUNITY)
Admission: EM | Admit: 2021-02-14 | Discharge: 2021-02-14 | Disposition: A | Discharge status: Home / Self Care | Payer: 59 | Attending: Family Medicine | Admitting: Family Medicine

## 2021-02-14 ENCOUNTER — Other Ambulatory Visit: Payer: Self-pay

## 2021-02-14 DIAGNOSIS — J4521 Mild intermittent asthma with (acute) exacerbation: Secondary | ICD-10-CM | POA: Diagnosis not present

## 2021-02-14 DIAGNOSIS — J069 Acute upper respiratory infection, unspecified: Secondary | ICD-10-CM

## 2021-02-14 MED ORDER — ALBUTEROL SULFATE HFA 108 (90 BASE) MCG/ACT IN AERS: 1.0000 | INHALATION_SPRAY | RESPIRATORY_TRACT | 0 refills | Status: AC | PRN

## 2021-02-14 MED ORDER — PREDNISONE 20 MG PO TABS: 40.0000 mg | ORAL_TABLET | Freq: Every day | ORAL | 0 refills | Status: AC

## 2021-02-14 MED ORDER — BENZONATATE 100 MG PO CAPS: 100.0000 mg | ORAL_CAPSULE | Freq: Three times a day (TID) | ORAL | 0 refills | Status: DC | PRN

## 2021-02-14 NOTE — ED Provider Notes (Signed)
Angela Gay    CSN: [id number] Arrival date & time: 02/14/21  0805      History   Chief Complaint Chief Complaint  Patient presents with   Cough    HPI Angela Gay is a 21 y.o. female.    Cough Here for a history of cough with mucus production since January 22.  She has not had any fever or chills.  She had a little sore throat at first which is resolved.  Not much nasal congestion.  No vomiting or diarrhea.  She does have a history of asthma, but has not noticed a lot of wheezing.  She did have COVID in mid December 2022.  History reviewed. No pertinent past medical history.  There are no problems to display for this patient.   History reviewed. No pertinent surgical history.  OB History   No obstetric history on file.      Home Medications    Prior to Admission medications   Medication Sig Start Date End Date Taking? Authorizing Provider  albuterol (VENTOLIN HFA) 108 (90 Base) MCG/ACT inhaler Inhale 1-2 puffs into the lungs every 4 (four) hours as needed for wheezing or shortness of breath. 02/14/21  Yes Feliciana Narayan, Gwenlyn Perking, MD  benzonatate (TESSALON) 100 MG capsule Take 1 capsule (100 mg total) by mouth 3 (three) times daily as needed for cough. 02/14/21  Yes Kylani Wires, Gwenlyn Perking, MD  predniSONE (DELTASONE) 20 MG tablet Take 2 tablets (40 mg total) by mouth daily with breakfast for 5 days. 02/14/21 02/19/21 Yes Barrett Henle, MD  cetirizine (ZYRTEC ALLERGY) 10 MG tablet Take 1 tablet (10 mg total) by mouth daily. 09/12/20   Hughie Closs, PA-C  EPINEPHrine 0.3 mg/0.3 mL IJ SOAJ injection Inject 0.3 mg into the muscle as needed for anaphylaxis. 09/12/20   Hughie Closs, PA-C  famotidine (PEPCID) 20 MG tablet Take 1 tablet (20 mg total) by mouth 2 (two) times daily. 09/13/20   Hughie Closs, PA-C  fluticasone (FLONASE) 50 MCG/ACT nasal spray Place 1 spray into both nostrils 2 (two) times daily. 09/04/20   Volney American, PA-C    Family  History No family history on file.  Social History Social History   Tobacco Use   Smoking status: Never   Smokeless tobacco: Never  Substance Use Topics   Alcohol use: Yes   Drug use: Yes    Types: Marijuana     Allergies   Augmentin [amoxicillin-pot clavulanate]   Review of Systems Review of Systems  Respiratory:  Positive for cough.     Physical Exam Triage Vital Signs ED Triage Vitals  Enc Vitals Group     BP 02/14/21 0829 112/75     Pulse Rate 02/14/21 0829 76     Resp 02/14/21 0829 17     Temp 02/14/21 0829 98.4 F (36.9 C)     Temp Source 02/14/21 0829 Oral     SpO2 02/14/21 0829 96 %     Weight --      Height --      Head Circumference --      Peak Flow --      Pain Score 02/14/21 0828 0     Pain Loc --      Pain Edu? --      Excl. in [place]? --    No data found.  Updated Vital Signs BP 112/75 (BP Location: Left Arm)    Pulse 76    Temp 98.4 F (36.9 C) (  Oral)    Resp 17    LMP 02/06/2021    SpO2 96%   Visual Acuity Right Eye Distance:   Left Eye Distance:   Bilateral Distance:    Right Eye Near:   Left Eye Near:    Bilateral Near:     Physical Exam Vitals reviewed.  Constitutional:      General: She is not in acute distress.    Appearance: She is not toxic-appearing.     Comments: Pt is coughing a dry cough, almost staccato  HENT:     Right Ear: Tympanic membrane and ear canal normal.     Left Ear: Tympanic membrane and ear canal normal.     Nose: Nose normal.     Mouth/Throat:     Mouth: Mucous membranes are moist.     Pharynx: No oropharyngeal exudate or posterior oropharyngeal erythema (mild erythema of posterior OP).  Eyes:     Extraocular Movements: Extraocular movements intact.     Conjunctiva/sclera: Conjunctivae normal.     Pupils: Pupils are equal, round, and reactive to light.  Cardiovascular:     Rate and Rhythm: Normal rate and regular rhythm.     Heart sounds: No murmur heard. Pulmonary:     Effort: Pulmonary effort is  normal. No respiratory distress.     Breath sounds: No wheezing, rhonchi or rales.     Comments: Good air movement Chest:     Chest wall: No tenderness.  Musculoskeletal:     Cervical back: Neck supple.  Lymphadenopathy:     Cervical: No cervical adenopathy.  Skin:    Capillary Refill: Capillary refill takes less than 2 seconds.     Coloration: Skin is not jaundiced or pale.  Neurological:     General: No focal deficit present.     Mental Status: She is alert and oriented to person, place, and time.  Psychiatric:        Behavior: Behavior normal.     UC Treatments / Results  Labs (all labs ordered are listed, but only abnormal results are displayed) Labs Reviewed - No data to display  EKG   Radiology No results found.  Procedures Procedures (including critical care time)  Medications Ordered in UC Medications - No data to display  Initial Impression / Assessment and Plan / UC Course  I have reviewed the triage vital signs and the nursing notes.  Pertinent labs & imaging results that were available during my care of the patient were reviewed by me and considered in my medical decision making (see chart for details).     Discussed it's unlikely for her to have COVID a month later again. No flu testing, as no fever. Will treat for poss asthma exacerbation with the staccato cough. Final Clinical Impressions(s) / UC Diagnoses   Final diagnoses:  Viral URI with cough  Mild intermittent asthma with exacerbation     Discharge Instructions      Take prednisone 20 mg 2 daily for 5 days.  Albuterol 2 puffs every 4 hours as needed for shortness of breath or wheezing       ED Prescriptions     Medication Sig Dispense Auth. Provider   albuterol (VENTOLIN HFA) 108 (90 Base) MCG/ACT inhaler Inhale 1-2 puffs into the lungs every 4 (four) hours as needed for wheezing or shortness of breath. 1 each Barrett Henle, MD   predniSONE (DELTASONE) 20 MG tablet Take 2  tablets (40 mg total) by mouth daily with breakfast for  5 days. 10 tablet Barrett Henle, MD   benzonatate (TESSALON) 100 MG capsule Take 1 capsule (100 mg total) by mouth 3 (three) times daily as needed for cough. 21 capsule Barrett Henle, MD      PDMP not reviewed this encounter.   Barrett Henle, MD 02/14/21 [phone]

## 2021-02-14 NOTE — Discharge Instructions (Addendum)
Take prednisone 20 mg 2 daily for 5 days.  Albuterol 2 puffs every 4 hours as needed for shortness of breath or wheezing  Benzonatate 100 mg 1 every 8 hours as needed for cough.

## 2021-02-14 NOTE — ED Triage Notes (Signed)
Pt reports since Sunday had persistent cough. Reports cough is productive getting up mucous. Hx covid before Christmas and asthma.

## 2021-03-08 ENCOUNTER — Encounter (HOSPITAL_COMMUNITY): Payer: Self-pay

## 2021-03-08 ENCOUNTER — Ambulatory Visit (HOSPITAL_COMMUNITY)
Admission: EM | Admit: 2021-03-08 | Discharge: 2021-03-08 | Disposition: A | Discharge status: Home / Self Care | Payer: 59 | Attending: Family Medicine | Admitting: Family Medicine

## 2021-03-08 ENCOUNTER — Other Ambulatory Visit: Payer: Self-pay | Admitting: Family Medicine

## 2021-03-08 DIAGNOSIS — J0100 Acute maxillary sinusitis, unspecified: Secondary | ICD-10-CM | POA: Diagnosis not present

## 2021-03-08 MED ORDER — FLUTICASONE PROPIONATE 50 MCG/ACT NA SUSP: 2.0000 | Freq: Every day | NASAL | 0 refills | Status: AC

## 2021-03-08 MED ORDER — CEFDINIR 300 MG PO CAPS: 600.0000 mg | ORAL_CAPSULE | Freq: Every day | ORAL | 0 refills | Status: AC

## 2021-03-08 NOTE — Telephone Encounter (Signed)
Not a provider in this practice. °

## 2021-03-08 NOTE — ED Triage Notes (Addendum)
Pt presents with sinus congestion and cough X 2 weeks with no relief with OTC medication.

## 2021-03-08 NOTE — Discharge Instructions (Addendum)
Take cefdinir 300 mg, 2 capsules together once daily for 1 week.  Use Flonase nose spray, 2 sprays in each nostril once daily

## 2021-03-08 NOTE — ED Provider Notes (Signed)
[facility]    CSN: [id number] Arrival date & time: 03/08/21  1014      History   Chief Complaint Chief Complaint  Patient presents with   Sinutitis     HPI Angela Gay is a 21 y.o. female.   HPI For 2 weeks of nasal congestion.  Then about a week ago she began coughing and she is now having some pressure in her right cheek and sinus area.  No recent fever or chills.  No vomiting or diarrhea.  I saw her approximately 3 weeks ago and at which time I gave her prednisone for 5 days for a possible asthma exacerbation.  She did improve with that treatment but then about a week later she started getting the nasal congestion  She does have asthma, and it has not been bothering her in the last few days  History reviewed. No pertinent past medical history.  There are no problems to display for this patient.   History reviewed. No pertinent surgical history.  OB History   No obstetric history on file.      Home Medications    Prior to Admission medications   Medication Sig Start Date End Date Taking? Authorizing Provider  albuterol (VENTOLIN HFA) 108 (90 Base) MCG/ACT inhaler Inhale 1-2 puffs into the lungs every 4 (four) hours as needed for wheezing or shortness of breath. 02/14/21   Alfonso Carden, Janace Aris, MD  cefdinir (OMNICEF) 300 MG capsule Take 2 capsules (600 mg total) by mouth daily. 03/08/21  Yes Zenia Resides, MD  cetirizine (ZYRTEC ALLERGY) 10 MG tablet Take 1 tablet (10 mg total) by mouth daily. 09/12/20   Rushie Chestnut, PA-C  EPINEPHrine 0.3 mg/0.3 mL IJ SOAJ injection Inject 0.3 mg into the muscle as needed for anaphylaxis. 09/12/20   Rushie Chestnut, PA-C  famotidine (PEPCID) 20 MG tablet Take 1 tablet (20 mg total) by mouth 2 (two) times daily. 09/13/20   Rushie Chestnut, PA-C  fluticasone (FLONASE) 50 MCG/ACT nasal spray Place 2 sprays into both nostrils daily. 03/08/21  Yes Abimael Zeiter, Janace Aris, MD    Family History Family History  Family  history unknown: Yes    Social History Social History   Tobacco Use   Smoking status: Never   Smokeless tobacco: Never  Substance Use Topics   Alcohol use: Yes   Drug use: Yes    Types: Marijuana     Allergies   Augmentin [amoxicillin-pot clavulanate]   Review of Systems Review of Systems   Physical Exam Triage Vital Signs ED Triage Vitals  Enc Vitals Group     BP 03/08/21 1045 113/66     Pulse Rate 03/08/21 1044 69     Resp 03/08/21 1044 17     Temp 03/08/21 1044 98.9 F (37.2 C)     Temp Source 03/08/21 1044 Oral     SpO2 03/08/21 1044 96 %     Weight --      Height --      Head Circumference --      Peak Flow --      Pain Score 03/08/21 1042 3     Pain Loc --      Pain Edu? --      Excl. in [place]? --    No data found.  Updated Vital Signs BP 113/66    Pulse 69    Temp 98.9 F (37.2 C) (Oral)    Resp 17    LMP 02/06/2021  SpO2 96%   Visual Acuity Right Eye Distance:   Left Eye Distance:   Bilateral Distance:    Right Eye Near:   Left Eye Near:    Bilateral Near:     Physical Exam Vitals reviewed.  Constitutional:      General: She is not in acute distress.    Appearance: She is not ill-appearing, toxic-appearing or diaphoretic.  HENT:     Right Ear: Tympanic membrane and ear canal normal.     Left Ear: Tympanic membrane and ear canal normal.     Nose: Nose normal.     Mouth/Throat:     Mouth: Mucous membranes are moist.     Pharynx: No posterior oropharyngeal erythema.     Comments: Some clear mucus draining in the posterior OP  Eyes:     Extraocular Movements: Extraocular movements intact.     Conjunctiva/sclera: Conjunctivae normal.     Pupils: Pupils are equal, round, and reactive to light.  Cardiovascular:     Rate and Rhythm: Normal rate and regular rhythm.     Heart sounds: No murmur heard. Pulmonary:     Effort: Pulmonary effort is normal.     Breath sounds: Normal breath sounds. No wheezing, rhonchi or rales.   Musculoskeletal:     Cervical back: Neck supple.  Lymphadenopathy:     Cervical: No cervical adenopathy.  Skin:    Capillary Refill: Capillary refill takes less than 2 seconds.     Coloration: Skin is not jaundiced or pale.  Neurological:     General: No focal deficit present.     Mental Status: She is oriented to person, place, and time.  Psychiatric:        Behavior: Behavior normal.     UC Treatments / Results  Labs (all labs ordered are listed, but only abnormal results are displayed) Labs Reviewed - No data to display  EKG   Radiology No results found.  Procedures Procedures (including critical care time)  Medications Ordered in UC Medications - No data to display  Initial Impression / Assessment and Plan / UC Course  I have reviewed the triage vital signs and the nursing notes.  Pertinent labs & imaging results that were available during my care of the patient were reviewed by me and considered in my medical decision making (see chart for details).     The double sickening and the length of symptoms, will treat for acute sinusitis. Final Clinical Impressions(s) / UC Diagnoses   Final diagnoses:  Acute maxillary sinusitis, recurrence not specified     Discharge Instructions      Take cefdinir 300 mg, 2 capsules together once daily for 1 week.  Use Flonase nose spray, 2 sprays in each nostril once daily       ED Prescriptions     Medication Sig Dispense Auth. Provider   fluticasone (FLONASE) 50 MCG/ACT nasal spray Place 2 sprays into both nostrils daily. 16 g Zenia Resides, MD   cefdinir (OMNICEF) 300 MG capsule Take 2 capsules (600 mg total) by mouth daily. 14 capsule Marlinda Mike, Janace Aris, MD      PDMP not reviewed this encounter.   Zenia Resides, MD 03/08/21 1137

## 2021-04-19 ENCOUNTER — Encounter (HOSPITAL_BASED_OUTPATIENT_CLINIC_OR_DEPARTMENT_OTHER): Payer: Self-pay | Admitting: Emergency Medicine

## 2021-04-19 ENCOUNTER — Other Ambulatory Visit: Payer: Self-pay

## 2021-04-19 ENCOUNTER — Emergency Department (HOSPITAL_BASED_OUTPATIENT_CLINIC_OR_DEPARTMENT_OTHER)
Admission: EM | Admit: 2021-04-19 | Discharge: 2021-04-19 | Disposition: A | Discharge status: Home / Self Care | Payer: 59 | Attending: Student | Admitting: Student

## 2021-04-19 DIAGNOSIS — R110 Nausea: Secondary | ICD-10-CM | POA: Insufficient documentation

## 2021-04-19 DIAGNOSIS — N946 Dysmenorrhea, unspecified: Secondary | ICD-10-CM | POA: Diagnosis present

## 2021-04-19 NOTE — ED Triage Notes (Signed)
PT reports she started menstural cycle today and having bad cramping. She states it is always that way and wants to find out why. She has GYN in [place] but is student here. States last visit was probably a year ago. She no longer takes [facility]. She denies having unprotected sex. Denies abnormal discharge.  ?

## 2021-04-19 NOTE — ED Provider Notes (Signed)
?[facility] EM[facility]PART[facility] ?Provider Note ? ? ?CSN: [id number] ?Arrival date & time: 04/19/21  1950 ? ?  ? ?History ? ?Chief Complaint  ?Patient presents with  ? Pelvic Pain  ? ? ?Angela Gay is a 21 y.o. female resenting with dysmenorrhea.  Says that her period started today and is extremely painful.  Says that this happens every couple of cycles however she would like to know why this is happening.  Says that her periods are regular, once a month and last between 7 and 8 days.  She used to be on OCPs for this however she stopped taking them because she wanted to be taken off of them.  Denies sexual activity or concern for STDs.  No abdominal pain.  Occasionally becomes nauseated with her menses.  Says that she uses Tylenol and heat packs but ibuprofen does not work.  Also says Midol did not work.  Does not have an OB/GYN in [place]. ? ? ? ?Home Medications ?Prior to Admission medications   ?Not on File  ?   ? ?Allergies    ?Patient has no known allergies.   ? ?Review of Systems   ?Review of Systems  ?Genitourinary:  Positive for pelvic pain. Negative for dysuria.  ?See HPI ? ?Physical Exam ?Updated Vital Signs ?BP (!) 102/46 (BP Location: Right Arm)   Pulse 69   Temp 98.1 ?F (36.7 ?C) (Oral)   Resp 18   Ht 5\' 6"  (1.676 m)   Wt 81.2 kg   LMP 04/19/2021   SpO2 98%   BMI 28.89 kg/m?  ?Physical Exam ?Vitals and nursing note reviewed.  ?Constitutional:   ?   General: She is not in acute distress. ?   Appearance: Normal appearance.  ?HENT:  ?   Head: Normocephalic and atraumatic.  ?Eyes:  ?   General: No scleral icterus. ?   Conjunctiva/sclera: Conjunctivae normal.  ?Pulmonary:  ?   Effort: Pulmonary effort is normal. No respiratory distress.  ?Abdominal:  ?   General: Abdomen is flat.  ?   Palpations: Abdomen is soft.  ?   Tenderness: There is no abdominal tenderness.  ?Genitourinary: ?   Comments: GU exam deferred per patient request ?Skin: ?   Findings: No rash.  ?Neurological:  ?   Mental  Status: She is alert.  ?Psychiatric:     ?   Mood and Affect: Mood normal.  ? ? ?ED Results / Procedures / Treatments   ?Labs ?(all labs ordered are listed, but only abnormal results are displayed) ?Labs Reviewed - No data to display ? ?EKG ?None ? ?Radiology ?No results found. ? ?Procedures ?Procedures  ? ?Medications Ordered in ED ?Medications - No data to display ? ?ED Course/ Medical Decision Making/ A&P ?  ?                        ?Medical Decision Making ? ?21 year old presenting with dysmenorrhea.  She is from 19 and does not have an OB/GYN.  Denied the need for pelvic exam and denied desire for Toradol shot.  I considered lab work to assess for anemia however patient has no signs or symptoms of this,  ? ?At this time she is stable and agreeable to discharge.  She will follow-up with an OB/GYN. ? ? ?Final Clinical Impression(s) / ED Diagnoses ?Final diagnoses:  ?Dysmenorrhea  ? ? ?Rx / DC Orders ? ? ?Results and diagnoses were explained to the patient. Return precautions discussed in  full. Patient had no additional questions and expressed complete understanding. ? ? ?This chart was dictated using voice recognition software.  Despite best efforts to proofread,  errors can occur which can change the documentation meaning.  ?  ?Saddie Benders, PA-C ?04/19/21 2044 ? ?  ?Glendora Score, MD ?04/19/21 2309 ? ?

## 2021-04-19 NOTE — Discharge Instructions (Addendum)
Follow-up with one of the GYN doctors attached to these discharge papers.  Your school note is also attached. ?

## 2021-04-22 ENCOUNTER — Encounter (HOSPITAL_COMMUNITY): Payer: Self-pay

## 2021-05-14 ENCOUNTER — Other Ambulatory Visit: Payer: Self-pay | Admitting: Family Medicine

## 2021-05-14 ENCOUNTER — Ambulatory Visit: Payer: Self-pay

## 2021-05-14 DIAGNOSIS — M25511 Pain in right shoulder: Secondary | ICD-10-CM

## 2021-09-04 ENCOUNTER — Encounter (HOSPITAL_COMMUNITY): Payer: Self-pay | Admitting: Emergency Medicine

## 2021-09-04 ENCOUNTER — Ambulatory Visit (HOSPITAL_COMMUNITY)
Admission: EM | Admit: 2021-09-04 | Discharge: 2021-09-04 | Disposition: A | Discharge status: Home / Self Care | Payer: 59 | Attending: Sports Medicine | Admitting: Sports Medicine

## 2021-09-04 ENCOUNTER — Telehealth (HOSPITAL_COMMUNITY): Payer: Self-pay | Admitting: Emergency Medicine

## 2021-09-04 DIAGNOSIS — M7521 Bicipital tendinitis, right shoulder: Secondary | ICD-10-CM | POA: Diagnosis not present

## 2021-09-04 MED ORDER — METHYLPREDNISOLONE 4 MG PO TBPK: ORAL_TABLET | ORAL | 0 refills | Status: DC

## 2021-09-04 MED ORDER — METHYLPREDNISOLONE 4 MG PO TBPK: ORAL_TABLET | ORAL | 0 refills | Status: AC

## 2021-09-04 NOTE — ED Triage Notes (Signed)
Pt reports had right shoulder pains and saw PT for it back in May. Pt reports that at work started having pain in right shoulder again past few days.

## 2021-09-04 NOTE — ED Provider Notes (Signed)
[facility]   [id number] 09/04/21 Arrival Time: 0865  ASSESSMENT & PLAN:  1. Biceps tendinitis of right upper extremity   -Started patient on Medrol Dosepak to calm down biceps inflammation.  Recommended relative rest from lifting, pushing, pulling with gymnastics.  Home exercises given for rehab.  All questions answered.   Meds ordered this encounter  Medications   DISCONTD: methylPREDNISolone (MEDROL DOSEPAK) 4 MG TBPK tablet    Sig: Day 1: 24 mg on day 1 administered as 8 mg before breakfast, 4 mg after lunch, 4 mg after supper, and 8 mg at bedtime   Day 2: 20 mg on day 2 administered as 4 mg before breakfast, 4 mg after lunch, 4 mg after supper, and 8 mg at bedtime.  Day 3: 16 mg on day 3 administered as 4 mg before breakfast, 4 mg after lunch, 4 mg after supper, and 4 mg at bedtime.  Day 4: 12 mg on day 4 administered as 4 mg before breakfast, 4 mg after lunch, and 4 mg at bedtime.  Day 5: 8 mg on day 5 administered as 4 mg before breakfast and 4 mg at bedtime.  Day 6: 4 mg on day 6 administered as 4 mg before breakfast.    Dispense:  21 tablet    Refill:  0   methylPREDNISolone (MEDROL DOSEPAK) 4 MG TBPK tablet    Sig: Day 1: 24 mg on day 1 administered as 8 mg before breakfast, 4 mg after lunch, 4 mg after supper, and 8 mg at bedtime   Day 2: 20 mg on day 2 administered as 4 mg before breakfast, 4 mg after lunch, 4 mg after supper, and 8 mg at bedtime.  Day 3: 16 mg on day 3 administered as 4 mg before breakfast, 4 mg after lunch, 4 mg after supper, and 4 mg at bedtime.  Day 4: 12 mg on day 4 administered as 4 mg before breakfast, 4 mg after lunch, and 4 mg at bedtime.  Day 5: 8 mg on day 5 administered as 4 mg before breakfast and 4 mg at bedtime.  Day 6: 4 mg on day 6 administered as 4 mg before breakfast.    Dispense:  21 tablet    Refill:  0     Discharge Instructions      You have biceps tendonitis in R shoulder Take the medrol dosepak as  prescribed, this will help with inflammation Rest and ice will also help Rehab exercises will be helpful too         Reviewed expectations re: course of current medical issues. Questions answered. Outlined signs and symptoms indicating need for more acute intervention. Patient verbalized understanding. After Visit Summary given.   SUBJECTIVE: Patient is a 21 year old female here for right shoulder pain.  This is been an ongoing issue for her for a number of months now.  She reports a sharp pain in the anterior aspect of the right shoulder.  It is made worse with forward flexion of the shoulder.  She previously had done some physical therapy but says it did not help very much.  She works as a Hydrographic surveyor.  Denies any acute trauma to the shoulder.  Denies any numbness or tingling down the arm.  Patient's last menstrual period was 08/13/2021. Past Surgical History:  Procedure Laterality Date   FRACTURE SURGERY     TIBIA FRACTURE SURGERY Right    tib/fib fx     OBJECTIVE:  Vitals:   09/04/21  1102  BP: 123/85  Pulse: 63  Resp: 16  Temp: 98.6 F (37 C)  SpO2: 98%     Physical Exam Vitals reviewed.  Constitutional:      Appearance: Normal appearance.  Cardiovascular:     Rate and Rhythm: Normal rate.  Pulmonary:     Effort: Pulmonary effort is normal.  Musculoskeletal:     Cervical back: Normal range of motion.     Comments: Right shoulder -on inspection there is no obvious deformity or overlying erythema.  On palpation she is tender at the biceps tendon.  Nontender along the distal clavicle, nontender at the [facility] joint.  She has full range of motion in abduction.  Forward flexion to about 140 degrees.  Full range of motion external rotation and internal rotation.  She has a negative Hawkins test.  She has a negative indican test.  Negative O'Brien's test.  She has a positive speeds test.  Positive Yergason test.  Neurological:     Mental Status: She is alert.       Labs: Results for orders placed or performed during the hospital encounter of 12/26/20  RPR  Result Value Ref Range   RPR Ser Ql NON REACTIVE NON REACTIVE  HIV antibody  Result Value Ref Range   HIV Screen 4th Generation wRfx Non Reactive Non Reactive  POC urine pregnancy  Result Value Ref Range   Preg Test, Ur NEGATIVE NEGATIVE  POCT Urinalysis Dipstick (ED/UC)  Result Value Ref Range   Glucose, UA NEGATIVE NEGATIVE mg/dL   Bilirubin Urine NEGATIVE NEGATIVE   Ketones, ur NEGATIVE NEGATIVE mg/dL   Specific Gravity, Urine >=1.030 1.005 - 1.030   Hgb urine dipstick NEGATIVE NEGATIVE   pH 6.0 5.0 - 8.0   Protein, ur NEGATIVE NEGATIVE mg/dL   Urobilinogen, UA 0.2 0.0 - 1.0 mg/dL   Nitrite NEGATIVE NEGATIVE   Leukocytes,Ua NEGATIVE NEGATIVE  Cervicovaginal ancillary only  Result Value Ref Range   Neisseria Gonorrhea Negative    Chlamydia Negative    Trichomonas Negative    Bacterial Vaginitis (gardnerella) Negative    Candida Vaginitis Negative    Candida Glabrata Negative    Comment      Normal Reference Range Bacterial Vaginosis - Negative   Comment Normal Reference Range Candida Species - Negative    Comment Normal Reference Range Candida Galbrata - Negative    Comment Normal Reference Range Trichomonas - Negative    Comment Normal Reference Ranger Chlamydia - Negative    Comment      Normal Reference Range Neisseria Gonorrhea - Negative   Labs Reviewed - No data to display  Imaging: No results found.   Allergies  Allergen Reactions   Augmentin [Amoxicillin-Pot Clavulanate] Hives                                               History reviewed. No pertinent past medical history.  Social History   Socioeconomic History   Marital status: Single    Spouse name: Not on file   Number of children: Not on file   Years of education: Not on file   Highest education level: Not on file  Occupational History   Not on file  Tobacco Use   Smoking status: Never    Smokeless tobacco: Never  Substance and Sexual Activity   Alcohol use: Yes   Drug use: Yes  Types: Marijuana   Sexual activity: Not on file  Other Topics Concern   Not on file  Social History Narrative   ** Merged History Encounter **       Social Determinants of Health   Financial Resource Strain: Not on file  Food Insecurity: Not on file  Transportation Needs: Not on file  Physical Activity: Not on file  Stress: Not on file  Social Connections: Not on file  Intimate Partner Violence: Not on file    Family History  Family history unknown: Yes      Amani Nodarse, Baldemar Friday, MD 09/04/21 1144

## 2021-09-04 NOTE — Discharge Instructions (Addendum)
You have biceps tendonitis in R shoulder Take the medrol dosepak as prescribed, this will help with inflammation Rest and ice will also help Rehab exercises will be helpful too

## 2021-09-04 NOTE — Telephone Encounter (Signed)
Patient called and states pharmacy does not have prescription.  Upon review in chart it appears it printed.  When I try to resend, same.

## 2022-08-15 IMAGING — DX DG SHOULDER 2+V*R*
3 series · 3 of 3 positions shown · non-contrast
Comparison: None.

CLINICAL DATA: Shoulder pain

EXAM:
RIGHT SHOULDER - 2+ VIEW

[shoulder grashey]
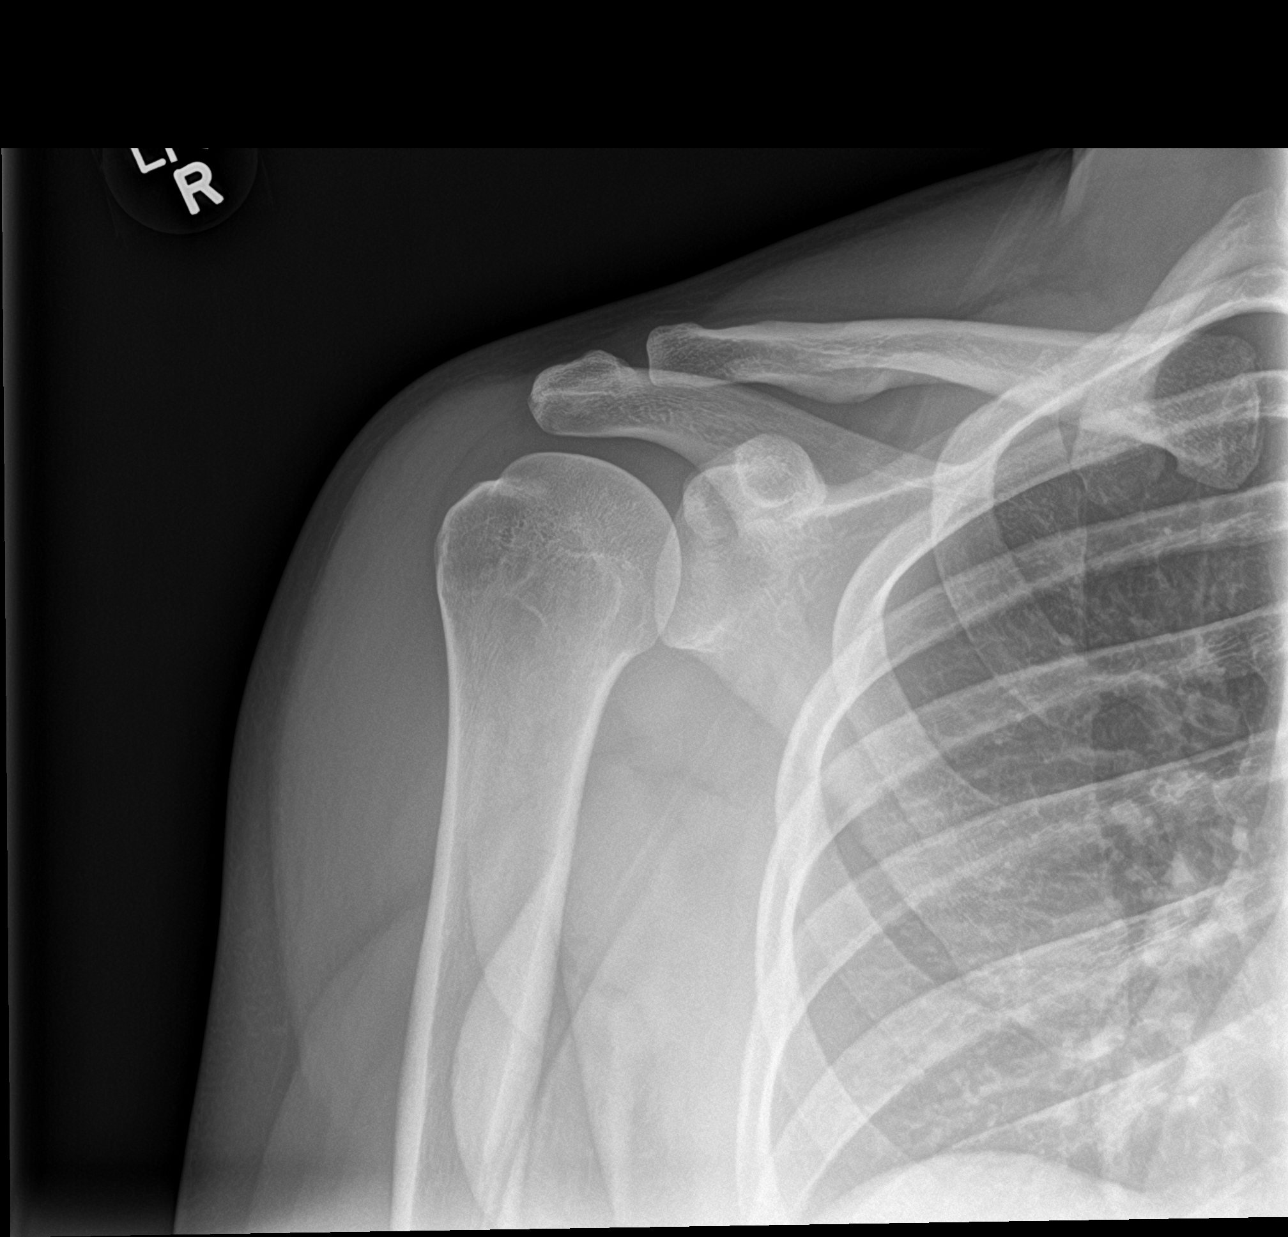

[shoulder y-view]
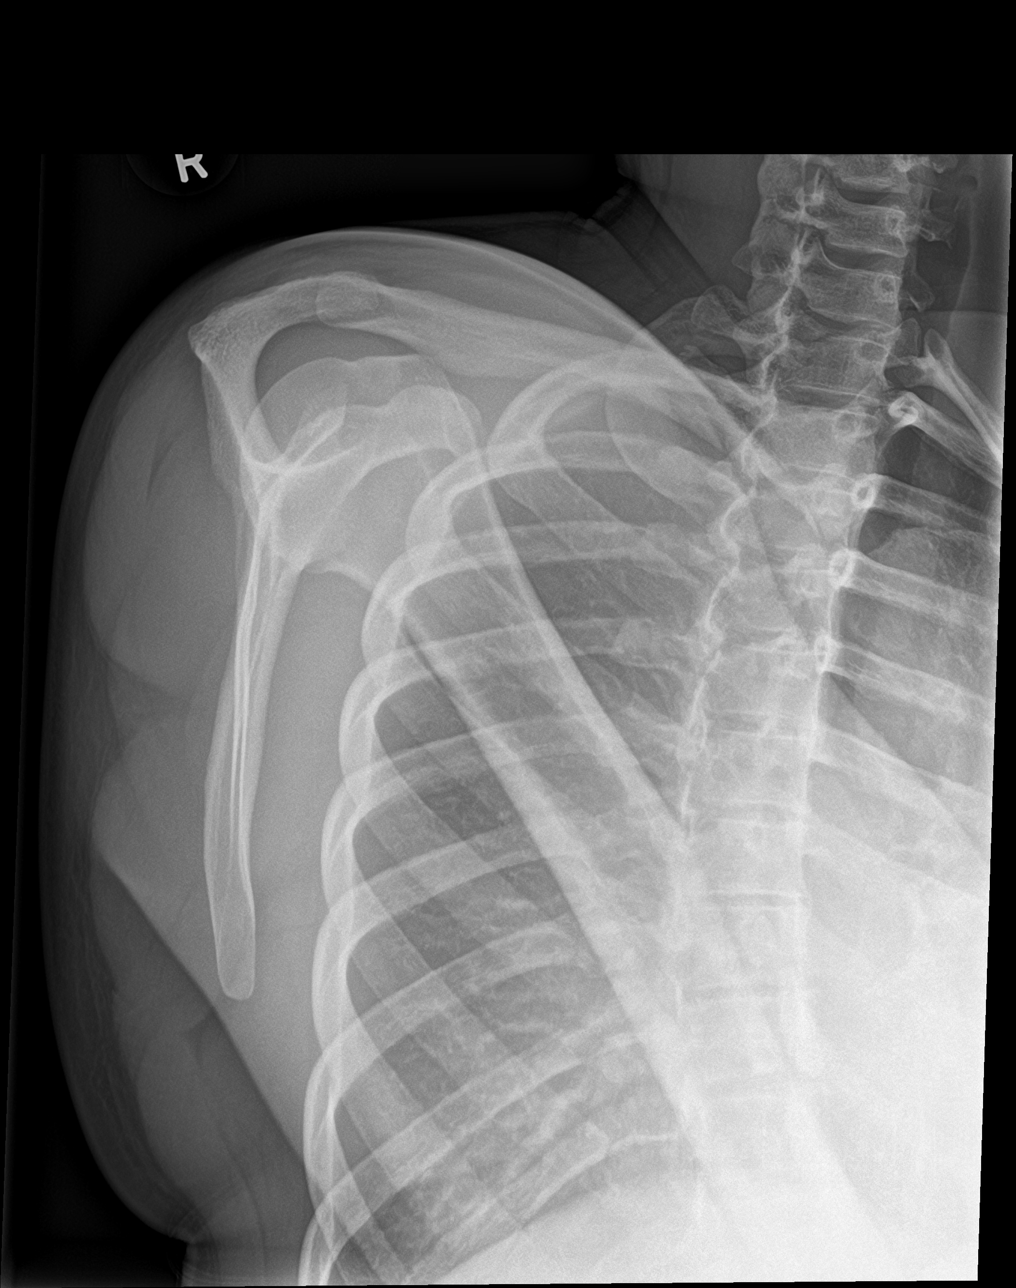

[shoulder axial]
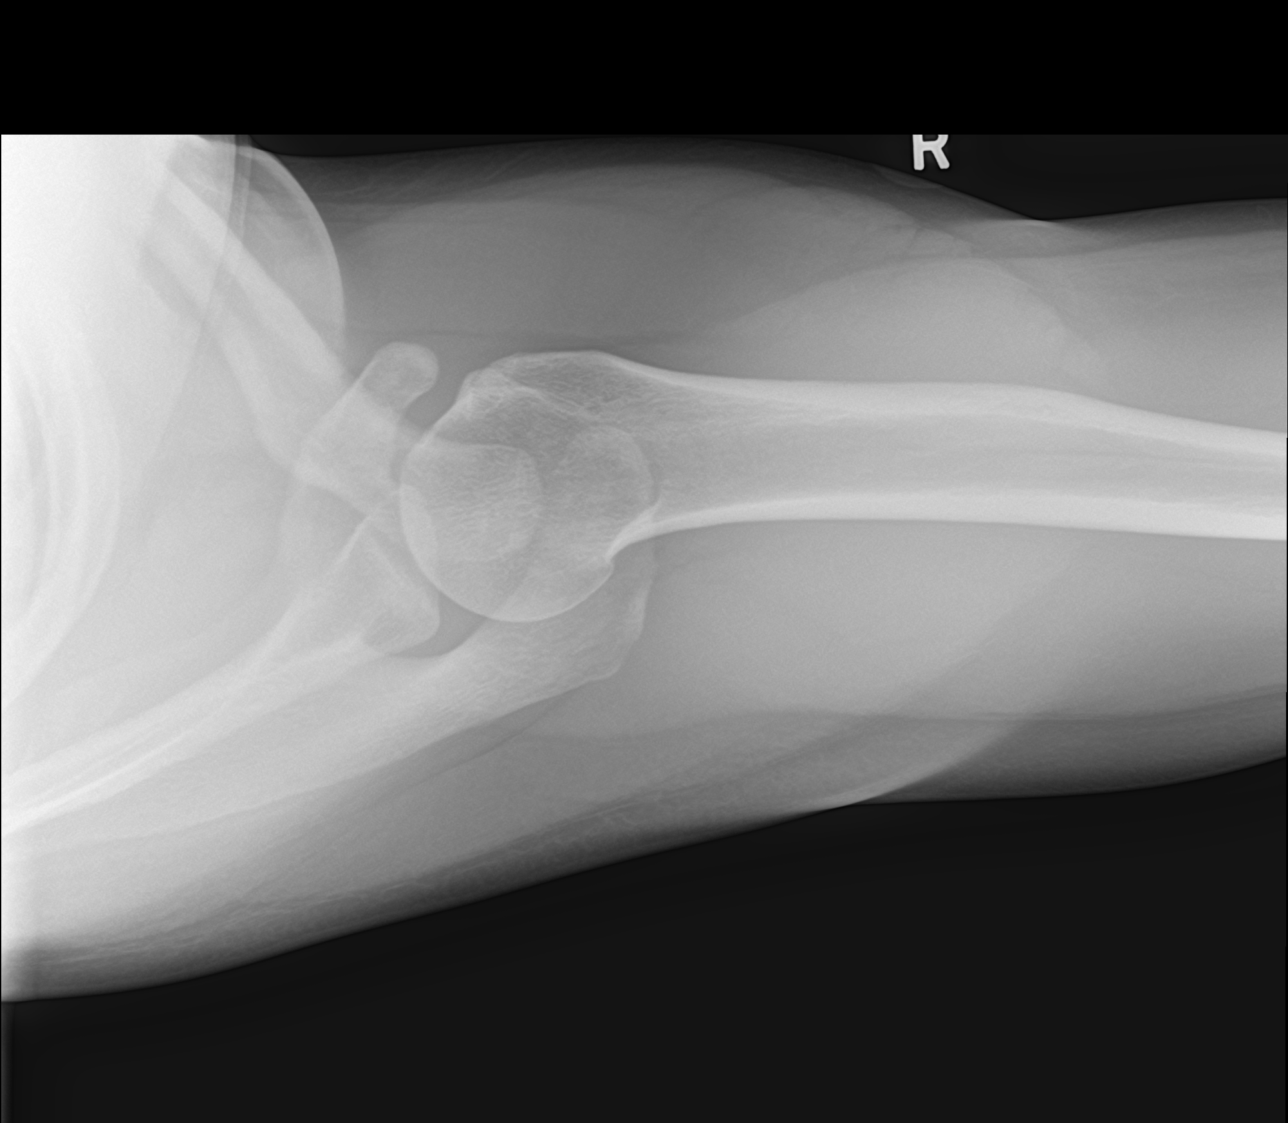

[3 of 3 positions shown; findings below may reference images not displayed]

FINDINGS: There is no evidence of fracture or dislocation. There is no
evidence of arthropathy or other focal bone abnormality. Soft
tissues are unremarkable.
IMPRESSION: No acute osseous abnormality identified.

## 2023-06-09 ENCOUNTER — Encounter (INDEPENDENT_AMBULATORY_CARE_PROVIDER_SITE_OTHER): Payer: Self-pay
